# Patient Record
Sex: Female | Born: 1995 | Race: Black or African American | Hispanic: No | Marital: Single | Smoking: Never smoker
Health system: Southern US, Community
[De-identification: ages and names within clinical notes are randomized; demographics above are authoritative.]

## PROBLEM LIST (undated history)

## (undated) DIAGNOSIS — Z789 Other specified health status: Secondary | ICD-10-CM

---
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
## 2013-05-18 ENCOUNTER — Encounter (HOSPITAL_COMMUNITY): Payer: Self-pay | Admitting: Orthopedic Surgery

## 2013-05-18 ENCOUNTER — Encounter (HOSPITAL_COMMUNITY): Payer: Self-pay

## 2013-05-19 ENCOUNTER — Observation Stay (HOSPITAL_COMMUNITY): Payer: BC Managed Care – PPO

## 2013-05-19 ENCOUNTER — Observation Stay (HOSPITAL_COMMUNITY)
Admission: RE | Admit: 2013-05-19 | Discharge: 2013-05-20 | Disposition: A | Discharge status: Home / Self Care | Payer: BC Managed Care – PPO | Source: Ambulatory Visit | Attending: Orthopedic Surgery | Admitting: Orthopedic Surgery

## 2013-05-19 ENCOUNTER — Ambulatory Visit (HOSPITAL_COMMUNITY): Payer: BC Managed Care – PPO | Admitting: Anesthesiology

## 2013-05-19 ENCOUNTER — Encounter (HOSPITAL_COMMUNITY): Payer: Self-pay | Admitting: *Deleted

## 2013-05-19 ENCOUNTER — Encounter (HOSPITAL_COMMUNITY): Payer: Self-pay | Admitting: Anesthesiology

## 2013-05-19 ENCOUNTER — Encounter (HOSPITAL_COMMUNITY)
Admission: RE | Disposition: A | Discharge status: Home / Self Care | Payer: Self-pay | Source: Ambulatory Visit | Attending: Orthopedic Surgery

## 2013-05-19 DIAGNOSIS — M84369A Stress fracture, unspecified tibia and fibula, initial encounter for fracture: Principal | ICD-10-CM | POA: Insufficient documentation

## 2013-05-19 DIAGNOSIS — X58XXXA Exposure to other specified factors, initial encounter: Secondary | ICD-10-CM | POA: Insufficient documentation

## 2013-05-19 DIAGNOSIS — S82201A Unspecified fracture of shaft of right tibia, initial encounter for closed fracture: Secondary | ICD-10-CM

## 2013-05-19 HISTORY — DX: Other specified health status: Z78.9

## 2013-05-19 HISTORY — PX: TIBIA IM NAIL INSERTION: SHX2516

## 2013-05-19 LAB — CBC
HCT: 40.3 % (ref 36.0–46.0)
Hemoglobin: 13.8 g/dL (ref 12.0–15.0)
RBC: 4.68 MIL/uL (ref 3.87–5.11)
WBC: 8.5 10*3/uL (ref 4.0–10.5)

## 2013-05-19 SURGERY — INSERTION, INTRAMEDULLARY ROD, TIBIA
Anesthesia: General | Site: Leg Lower | Laterality: Right | Wound class: Clean

## 2013-05-19 MED ORDER — BUPIVACAINE-EPINEPHRINE (PF) 0.5% -1:200000 IJ SOLN
INTRAMUSCULAR | Status: AC
  Filled 2013-05-19: qty 10

## 2013-05-19 MED ORDER — METHOCARBAMOL 500 MG PO TABS
500.0000 mg | ORAL_TABLET | Freq: Four times a day (QID) | ORAL | Status: DC | PRN
  Administered 2013-05-19 – 2013-05-20 (×2): 500 mg via ORAL
  Filled 2013-05-19 (×2): qty 1

## 2013-05-19 MED ORDER — EPHEDRINE SULFATE 50 MG/ML IJ SOLN
INTRAMUSCULAR | Status: DC | PRN
  Administered 2013-05-19 (×6): 5 mg via INTRAVENOUS

## 2013-05-19 MED ORDER — HYDROMORPHONE HCL PF 1 MG/ML IJ SOLN
0.2500 mg | INTRAMUSCULAR | Status: DC | PRN
  Administered 2013-05-19 (×2): 0.5 mg via INTRAVENOUS

## 2013-05-19 MED ORDER — ONDANSETRON HCL 4 MG/2ML IJ SOLN: 4.0000 mg | Freq: Four times a day (QID) | INTRAMUSCULAR | Status: DC | PRN

## 2013-05-19 MED ORDER — ONDANSETRON HCL 4 MG PO TABS: 4.0000 mg | ORAL_TABLET | Freq: Four times a day (QID) | ORAL | Status: DC | PRN

## 2013-05-19 MED ORDER — ONDANSETRON HCL 4 MG/2ML IJ SOLN
INTRAMUSCULAR | Status: DC | PRN
  Administered 2013-05-19: 4 mg via INTRAVENOUS

## 2013-05-19 MED ORDER — BUPIVACAINE HCL (PF) 0.5 % IJ SOLN
INTRAMUSCULAR | Status: AC
  Filled 2013-05-19: qty 30

## 2013-05-19 MED ORDER — METOCLOPRAMIDE HCL 5 MG/ML IJ SOLN: 5.0000 mg | Freq: Three times a day (TID) | INTRAMUSCULAR | Status: DC | PRN

## 2013-05-19 MED ORDER — METHOCARBAMOL 100 MG/ML IJ SOLN
500.0000 mg | Freq: Four times a day (QID) | INTRAVENOUS | Status: DC | PRN
  Filled 2013-05-19: qty 5

## 2013-05-19 MED ORDER — KETOROLAC TROMETHAMINE 30 MG/ML IJ SOLN
30.0000 mg | Freq: Once | INTRAMUSCULAR | Status: AC
  Administered 2013-05-19: 30 mg via INTRAVENOUS

## 2013-05-19 MED ORDER — GLYCOPYRROLATE 0.2 MG/ML IJ SOLN
INTRAMUSCULAR | Status: DC | PRN
  Administered 2013-05-19 (×2): 0.1 mg via INTRAVENOUS

## 2013-05-19 MED ORDER — OXYCODONE HCL 5 MG PO TABS: 5.0000 mg | ORAL_TABLET | Freq: Once | ORAL | Status: DC | PRN

## 2013-05-19 MED ORDER — FENTANYL CITRATE 0.05 MG/ML IJ SOLN
INTRAMUSCULAR | Status: DC | PRN
  Administered 2013-05-19: 50 ug via INTRAVENOUS
  Administered 2013-05-19 (×2): 25 ug via INTRAVENOUS
  Administered 2013-05-19: 50 ug via INTRAVENOUS
  Administered 2013-05-19 (×4): 25 ug via INTRAVENOUS

## 2013-05-19 MED ORDER — LACTATED RINGERS IV SOLN
INTRAVENOUS | Status: DC
  Administered 2013-05-19 (×2): via INTRAVENOUS

## 2013-05-19 MED ORDER — BUPIVACAINE-EPINEPHRINE 0.5% -1:200000 IJ SOLN
INTRAMUSCULAR | Status: DC | PRN
  Administered 2013-05-19: 30 mL

## 2013-05-19 MED ORDER — PROPOFOL 10 MG/ML IV BOLUS
INTRAVENOUS | Status: DC | PRN
  Administered 2013-05-19: 200 mg via INTRAVENOUS

## 2013-05-19 MED ORDER — CEFAZOLIN SODIUM-DEXTROSE 2-3 GM-% IV SOLR
INTRAVENOUS | Status: DC | PRN
  Administered 2013-05-19: 2 g via INTRAVENOUS

## 2013-05-19 MED ORDER — ASPIRIN EC 325 MG PO TBEC: 325.0000 mg | DELAYED_RELEASE_TABLET | Freq: Every day | ORAL | Status: AC

## 2013-05-19 MED ORDER — POTASSIUM CHLORIDE IN NACL 20-0.9 MEQ/L-% IV SOLN
INTRAVENOUS | Status: AC
  Administered 2013-05-19: 19:00:00 via INTRAVENOUS
  Filled 2013-05-19: qty 1000

## 2013-05-19 MED ORDER — HYDROCODONE-ACETAMINOPHEN 5-325 MG PO TABS: 1.0000 | ORAL_TABLET | Freq: Four times a day (QID) | ORAL | Status: AC | PRN

## 2013-05-19 MED ORDER — DEXAMETHASONE SODIUM PHOSPHATE 4 MG/ML IJ SOLN
INTRAMUSCULAR | Status: DC | PRN
  Administered 2013-05-19: 8 mg via INTRAVENOUS

## 2013-05-19 MED ORDER — CEFAZOLIN SODIUM 1-5 GM-% IV SOLN
1.0000 g | Freq: Four times a day (QID) | INTRAVENOUS | Status: AC
  Administered 2013-05-19: 1 g via INTRAVENOUS
  Filled 2013-05-19: qty 50

## 2013-05-19 MED ORDER — OXYCODONE HCL 5 MG/5ML PO SOLN: 5.0000 mg | Freq: Once | ORAL | Status: DC | PRN

## 2013-05-19 MED ORDER — HYDROMORPHONE HCL PF 1 MG/ML IJ SOLN
INTRAMUSCULAR | Status: AC
  Administered 2013-05-19: 0.5 mg via INTRAVENOUS
  Filled 2013-05-19: qty 1

## 2013-05-19 MED ORDER — MIDAZOLAM HCL 5 MG/5ML IJ SOLN
INTRAMUSCULAR | Status: DC | PRN
  Administered 2013-05-19: 2 mg via INTRAVENOUS

## 2013-05-19 MED ORDER — METOCLOPRAMIDE HCL 10 MG PO TABS: 5.0000 mg | ORAL_TABLET | Freq: Three times a day (TID) | ORAL | Status: DC | PRN

## 2013-05-19 MED ORDER — BUPIVACAINE HCL 0.5 % IJ SOLN
INTRAMUSCULAR | Status: DC | PRN
  Administered 2013-05-19: 15 mL

## 2013-05-19 MED ORDER — 0.9 % SODIUM CHLORIDE (POUR BTL) OPTIME
TOPICAL | Status: DC | PRN
  Administered 2013-05-19: 3000 mL

## 2013-05-19 MED ORDER — LIDOCAINE HCL (CARDIAC) 20 MG/ML IV SOLN
INTRAVENOUS | Status: DC | PRN
  Administered 2013-05-19: 100 mg via INTRAVENOUS

## 2013-05-19 MED ORDER — HYDROCODONE-ACETAMINOPHEN 10-325 MG PO TABS
1.0000 | ORAL_TABLET | ORAL | Status: DC | PRN
  Administered 2013-05-19 (×2): 1 via ORAL
  Administered 2013-05-20: 2 via ORAL
  Filled 2013-05-19: qty 1
  Filled 2013-05-19: qty 2
  Filled 2013-05-19: qty 1

## 2013-05-19 MED ORDER — METOCLOPRAMIDE HCL 5 MG/ML IJ SOLN: 10.0000 mg | Freq: Once | INTRAMUSCULAR | Status: DC | PRN

## 2013-05-19 MED ORDER — MORPHINE SULFATE 2 MG/ML IJ SOLN
1.0000 mg | INTRAMUSCULAR | Status: DC | PRN
  Administered 2013-05-19: 1 mg via INTRAVENOUS
  Filled 2013-05-19: qty 1

## 2013-05-19 MED ORDER — KETOROLAC TROMETHAMINE 30 MG/ML IJ SOLN
INTRAMUSCULAR | Status: AC
  Administered 2013-05-19: 30 mg via INTRAVENOUS
  Filled 2013-05-19: qty 1

## 2013-05-19 SURGICAL SUPPLY — 73 items
BANDAGE ELASTIC 4 VELCRO ST LF (GAUZE/BANDAGES/DRESSINGS) ×2 IMPLANT
BANDAGE ELASTIC 6 VELCRO ST LF (GAUZE/BANDAGES/DRESSINGS) ×2 IMPLANT
BANDAGE ESMARK 6X9 LF (GAUZE/BANDAGES/DRESSINGS) IMPLANT
BANDAGE GAUZE ELAST BULKY 4 IN (GAUZE/BANDAGES/DRESSINGS) ×2 IMPLANT
BENZOIN TINCTURE PRP APPL 2/3 (GAUZE/BANDAGES/DRESSINGS) ×2 IMPLANT
BIT DRILL LONG 4.0 (BIT) ×1 IMPLANT
BIT DRILL SHORT 4.0 (BIT) ×1 IMPLANT
BLADE SURG 15 STRL LF DISP TIS (BLADE) ×1 IMPLANT
BLADE SURG 15 STRL SS (BLADE) ×1
BLADE SURG ROTATE 9660 (MISCELLANEOUS) IMPLANT
BNDG COHESIVE 6X5 TAN STRL LF (GAUZE/BANDAGES/DRESSINGS) ×2 IMPLANT
BNDG ESMARK 6X9 LF (GAUZE/BANDAGES/DRESSINGS)
CLOTH BEACON ORANGE TIMEOUT ST (SAFETY) ×2 IMPLANT
CLSR STERI-STRIP ANTIMIC 1/2X4 (GAUZE/BANDAGES/DRESSINGS) ×2 IMPLANT
COVER SURGICAL LIGHT HANDLE (MISCELLANEOUS) ×4 IMPLANT
CUFF TOURNIQUET SINGLE 34IN LL (TOURNIQUET CUFF) IMPLANT
CUFF TOURNIQUET SINGLE 44IN (TOURNIQUET CUFF) IMPLANT
DRAPE C-ARM 42X72 X-RAY (DRAPES) ×2 IMPLANT
DRAPE INCISE IOBAN 66X45 STRL (DRAPES) ×2 IMPLANT
DRAPE ORTHO SPLIT 77X108 STRL (DRAPES) ×2
DRAPE PROXIMA HALF (DRAPES) ×4 IMPLANT
DRAPE SURG ORHT 6 SPLT 77X108 (DRAPES) ×2 IMPLANT
DRAPE U-SHAPE 47X51 STRL (DRAPES) ×2 IMPLANT
DRILL BIT LONG 4.0 (BIT) ×2
DRILL BIT SHORT 4.0 (BIT) ×1
DRSG PAD ABDOMINAL 8X10 ST (GAUZE/BANDAGES/DRESSINGS) ×2 IMPLANT
DURAPREP 26ML APPLICATOR (WOUND CARE) ×2 IMPLANT
ELECT REM PT RETURN 9FT ADLT (ELECTROSURGICAL) ×2
ELECTRODE REM PT RTRN 9FT ADLT (ELECTROSURGICAL) ×1 IMPLANT
FACESHIELD LNG OPTICON STERILE (SAFETY) IMPLANT
GAUZE XEROFORM 5X9 LF (GAUZE/BANDAGES/DRESSINGS) ×2 IMPLANT
GLOVE BIO SURGEON STRL SZ7.5 (GLOVE) ×2 IMPLANT
GLOVE BIOGEL M SZ8.5 STRL (GLOVE) ×2 IMPLANT
GLOVE BIOGEL PI IND STRL 7.5 (GLOVE) ×1 IMPLANT
GLOVE BIOGEL PI IND STRL 8 (GLOVE) ×1 IMPLANT
GLOVE BIOGEL PI INDICATOR 7.5 (GLOVE) ×1
GLOVE BIOGEL PI INDICATOR 8 (GLOVE) ×1
GLOVE SURG ORTHO 8.0 STRL STRW (GLOVE) ×2 IMPLANT
GLOVE SURG SS PI 7.5 STRL IVOR (GLOVE) ×2 IMPLANT
GOWN BRE IMP PREV XXLGXLNG (GOWN DISPOSABLE) ×2 IMPLANT
GOWN PREVENTION PLUS LG XLONG (DISPOSABLE) IMPLANT
GOWN PREVENTION PLUS XLARGE (GOWN DISPOSABLE) ×2 IMPLANT
GOWN STRL NON-REIN LRG LVL3 (GOWN DISPOSABLE) ×6 IMPLANT
GUIDE PIN 3.2MM (MISCELLANEOUS) ×2
GUIDE PIN ORTH 343X3.2XBRAD (MISCELLANEOUS) ×2 IMPLANT
GUIDE ROD 3.0 (MISCELLANEOUS) ×2
KIT BASIN OR (CUSTOM PROCEDURE TRAY) ×2 IMPLANT
KIT ROOM TURNOVER OR (KITS) ×2 IMPLANT
MANIFOLD NEPTUNE II (INSTRUMENTS) ×2 IMPLANT
NAIL TIBIAL 8.5X37 (Nail) ×2 IMPLANT
NEEDLE HYPO 25GX1X1/2 BEV (NEEDLE) ×2 IMPLANT
NS IRRIG 1000ML POUR BTL (IV SOLUTION) ×6 IMPLANT
PACK GENERAL/GYN (CUSTOM PROCEDURE TRAY) ×2 IMPLANT
PAD ARMBOARD 7.5X6 YLW CONV (MISCELLANEOUS) ×4 IMPLANT
ROD GUIDE 3.0 (MISCELLANEOUS) ×1 IMPLANT
SCREW LP CAPTR TRI 4.5X30 (Screw) ×1 IMPLANT
SCREW TRIGEN LOW PROF 4.5X30 (Screw) ×2 IMPLANT
SCREW TRIGEN LOW PROF 4.5X50 (Screw) ×2 IMPLANT
SCREW TRIGEN LOW PROF 4.5X52.5 (Screw) ×2 IMPLANT
SPONGE GAUZE 4X4 12PLY (GAUZE/BANDAGES/DRESSINGS) ×2 IMPLANT
STAPLER VISISTAT 35W (STAPLE) ×2 IMPLANT
STOCKINETTE IMPERVIOUS LG (DRAPES) ×2 IMPLANT
SUT ETHILON 3 0 PS 1 (SUTURE) ×2 IMPLANT
SUT PROLENE 3 0 SH DA (SUTURE) ×2 IMPLANT
SUT VIC AB 0 CT1 27 (SUTURE) ×1
SUT VIC AB 0 CT1 27XBRD ANBCTR (SUTURE) ×1 IMPLANT
SUT VIC AB 2-0 CT1 27 (SUTURE) ×1
SUT VIC AB 2-0 CT1 TAPERPNT 27 (SUTURE) ×1 IMPLANT
SYR CONTROL 10ML LL (SYRINGE) ×2 IMPLANT
TOWEL OR 17X24 6PK STRL BLUE (TOWEL DISPOSABLE) ×2 IMPLANT
TOWEL OR 17X26 10 PK STRL BLUE (TOWEL DISPOSABLE) ×2 IMPLANT
TRAY FOLEY CATH 16FRSI W/METER (SET/KITS/TRAYS/PACK) IMPLANT
WATER STERILE IRR 1000ML POUR (IV SOLUTION) ×2 IMPLANT

## 2013-05-19 NOTE — Preoperative (Signed)
Beta Blockers   Reason not to administer Beta Blockers:Not Applicable 

## 2013-05-19 NOTE — Op Note (Signed)
NAMEMORGAINE, [name]             ACCOUNT NO.:  [id number]  MEDICAL RECORD NO.:  [id number]  LOCATION:  5N24C                        FACILITY:  [facility]  PHYSICIAN:  Burnard Bunting, M.D.    DATE OF BIRTH:  [birth year]  DATE OF PROCEDURE: DATE OF DISCHARGE:                              OPERATIVE REPORT   PREOPERATIVE DIAGNOSIS:  Right tibial stress fracture.  POSTOPERATIVE DIAGNOSIS:  Right tibial stress fracture.  PROCEDURE:  Right tibial IM nail Smith and Nephew nail, 1 proximal and 1 distal interlocking 8.5 x 37 mm, 4.5 mm x 50 proximal screw, 4.5 mm x 30 distal screw.  INDICATIONS:  Sara Bennett is a patient with right tibial stress fracture, presents for operative management after explanation of risks and benefits.  PROCEDURE IN DETAIL:  The patient was brought to operating room, where general endotracheal anesthesia was induced.  Preop antibiotics were administered.  Time-out was called.  Right leg was prescrubbed with alcohol and Betadine, prepped with DuraPrep solution and draped in a sterile manner.  Collier Flowers was used to cover the operative field.  Time-out was called.  The incision was made on the medial aspect of the patellar tendon.  Skin and subcutaneous tissue was sharply divided.  Fat pad was elevated off the tibial plateau.  Guide pin was placed in the central portion of the plateau.  A 1 cm posterior guide pin placed and then taken with the knee in full flexion into the tibial shaft.  Correct placement of guide pins were confirmed from the AP and lateral planes. Proximal reaming was performed.  The guide pin was then placed down to the center of the ankle joint.  Reaming was performed up to 9.5 mm.  An 8.5 x 37 nail was then placed, and 1 proximal interlocking screw, and 1 distal locking screw placed.  All incisions were thoroughly irrigated. Incisions were proximally closed using #1 Vicryl, #0 Vicryl, 2-0 Vicryl, and 3-0 Prolene.  Distal and proximal interlock  incisions were irrigated and closed using 3-0 nylon.  Bulky dressing applied.  The patient tolerated the procedure well without immediate complications.     Burnard Bunting, M.D.     GSD/MEDQ  D:  05/19/2013  T:  05/19/2013  Job:  [id number]

## 2013-05-19 NOTE — Brief Op Note (Signed)
05/19/2013  3:22 PM  PATIENT:  Sara Bennett  17 y.o. female  PRE-OPERATIVE DIAGNOSIS:  Tibial Stress Fracture  POST-OPERATIVE DIAGNOSIS:  Tibial Stress Fracture  PROCEDURE:  Procedure(s): INTRAMEDULLARY (IM) NAIL TIBIAL  SURGEON:  Surgeon(s): Cammy Copa, MD  ASSISTANT:   ANESTHESIA:   general  EBL: 50 ml    Total I/O In: 1000 [I.V.:1000] Out: -   BLOOD ADMINISTERED: none  DRAINS: none   LOCAL MEDICATIONS USED:  Lidocaine   SPECIMEN:  No Specimen  COUNTS:  YES  TOURNIQUET:  * No tourniquets in log *  DICTATION: .Other Dictation: Dictation Number [id number]  PLAN OF CARE: Discharge to home after PACU  PATIENT DISPOSITION:  PACU - hemodynamically stable

## 2013-05-19 NOTE — Progress Notes (Signed)
Pt for obs s/p tibial nail Dc am rx on chart WBAT

## 2013-05-19 NOTE — OR Nursing (Signed)
OR documentation done by E. Niraj Kudrna RNFA from 1455 to the end of the case.

## 2013-05-19 NOTE — H&P (Signed)
Sara Bennett is an 17 y.o. female.   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 She was evaluated by Denyse Amass the athletic trainer who discovered on radiograph and examination that she had a tibial stress fracture. She reports pain with weightbearing. Her pain is increasing gradually over the past week. Denies a history of injury. 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 She denies any left leg symptoms. Denies any numbness and tingling in her foot.  Past Medical History  Diagnosis Date  . Medical history non-contributory     Past Surgical History  Procedure Laterality Date  . Cyst excision  2013    back side  . Fasciotomy Right 2013    lower leg    History reviewed. No pertinent family history. Social History:  reports that she has never smoked. She does not have any smokeless tobacco history on file. She reports that she does not drink alcohol or use illicit drugs.  Allergies: No Known Allergies  Medications Prior to Admission  Medication Sig Dispense Refill  . ibuprofen (ADVIL,MOTRIN) 200 MG tablet Take 200 mg by mouth every 6 (six) hours as needed for pain.      . Loratadine (CLARITIN) 10 MG CAPS Take by mouth.        Results for orders placed during the hospital encounter of 05/19/13 (from the past 48 hour(s))  CBC     Status: None   Collection Time    05/19/13 12:16 PM      Result Value Range   WBC 8.5  4.0 - 10.5 K/uL   RBC 4.68  3.87 - 5.11 MIL/uL   Hemoglobin 13.8  12.0 - 15.0 g/dL   HCT 09.8  11.9 - 14.7 %   MCV 86.1  78.0 - 100.0 fL   MCH 29.5  26.0 - 34.0 pg   MCHC 34.2  30.0 - 36.0 g/dL   RDW 82.9  56.2 - 13.0 %   Platelets 228  150 - 400 K/uL   No results found.  Review of Systems  Constitutional: Negative.   HENT: Negative.   Eyes: Negative.   Respiratory: Negative.   Cardiovascular: Negative.    Gastrointestinal: Negative.   Genitourinary: Negative.   Musculoskeletal: Positive for joint pain.  Skin: Negative.   Neurological: Negative.   Endo/Heme/Allergies: Negative.   Psychiatric/Behavioral: Negative.     Blood pressure 130/71, pulse 76, temperature 97.1 F (36.2 C), temperature source Oral, resp. rate 20, last menstrual period 05/13/2013, SpO2 99.00%. Physical Exam  Constitutional: She appears well-developed.  HENT:  Head: Normocephalic.  Eyes: Pupils are equal, round, and reactive to light.  Neck: Normal range of motion.  Cardiovascular: Normal rate.   Respiratory: Effort normal.  GI: Soft.  Neurological: She is alert.  Skin: Skin is warm.  Psychiatric: She has a normal mood and affect.   the insertion redemonstrate soft compartments palpable pedal pulses focal tenderness at the junction of the proximal two thirds and distal one third of the tibia with exostosis present. Ankle dorsiflexion plantar flexion is intact.  Assessment/Plan Impression is right leg tibial stress fracture with black line visible on lateral radiographs. Plan intramedullary nail tibia. Risk and benefits discussed with the patient and her father via phone call today including a limited to infection nerve vessel damage incomplete healing and persistent pain anticipate about 4 week return to playing and practicing basketball. There  outpatient observation tonight based on her symptoms. All questions answered. No family history of DVT.Rise Paganini SCOTT 05/19/2013, 12:55 PM

## 2013-05-19 NOTE — Transfer of Care (Signed)
Immediate Anesthesia Transfer of Care Note  Patient: Sara Bennett  Procedure(s) Performed: Procedure(s): INTRAMEDULLARY (IM) NAIL TIBIAL (Right)  Patient Location: PACU  Anesthesia Type:General  Level of Consciousness: sedated  Airway & Oxygen Therapy: Patient connected to nasal cannula oxygen  Post-op Assessment: Report given to PACU RN and Post -op Vital signs reviewed and stable  Post vital signs: Reviewed and stable  Complications: No apparent anesthesia complications

## 2013-05-19 NOTE — Anesthesia Preprocedure Evaluation (Signed)
Anesthesia Evaluation  Patient identified by MRN, date of birth, ID band Patient awake    Reviewed: Allergy & Precautions, H&P , NPO status , Patient's Chart, lab work & pertinent test results, reviewed documented beta blocker date and time   Airway Mallampati: II TM Distance: >3 FB Neck ROM: full    Dental   Pulmonary neg pulmonary ROS,  breath sounds clear to auscultation        Cardiovascular negative cardio ROS  Rhythm:regular     Neuro/Psych negative neurological ROS  negative psych ROS   GI/Hepatic negative GI ROS, Neg liver ROS,   Endo/Other  negative endocrine ROS  Renal/GU negative Renal ROS  negative genitourinary   Musculoskeletal   Abdominal   Peds  Hematology negative hematology ROS (+)   Anesthesia Other Findings See surgeon's H&P   Reproductive/Obstetrics negative OB ROS                           Anesthesia Physical Anesthesia Plan  ASA: I  Anesthesia Plan: General   Post-op Pain Management:    Induction: Intravenous  Airway Management Planned: LMA  Additional Equipment:   Intra-op Plan:   Post-operative Plan:   Informed Consent: I have reviewed the patients History and Physical, chart, labs and discussed the procedure including the risks, benefits and alternatives for the proposed anesthesia with the patient or authorized representative who has indicated his/her understanding and acceptance.   Dental Advisory Given  Plan Discussed with: CRNA and Surgeon  Anesthesia Plan Comments:         Anesthesia Quick Evaluation  

## 2013-05-19 NOTE — Anesthesia Procedure Notes (Signed)
Procedure Name: LMA Insertion Date/Time: 05/19/2013 1:22 PM Performed by: Charm Barges, Lydiana Milley R Pre-anesthesia Checklist: Patient identified, Emergency Drugs available, Suction available, Patient being monitored and Timeout performed Patient Re-evaluated:Patient Re-evaluated prior to inductionOxygen Delivery Method: Circle system utilized Preoxygenation: Pre-oxygenation with 100% oxygen Intubation Type: IV induction Ventilation: Mask ventilation without difficulty LMA: LMA inserted LMA Size: 5.0 Number of attempts: 1 Placement Confirmation: positive ETCO2 and breath sounds checked- equal and bilateral Tube secured with: Tape Dental Injury: Teeth and Oropharynx as per pre-operative assessment

## 2013-05-20 DIAGNOSIS — M84369A Stress fracture, unspecified tibia and fibula, initial encounter for fracture: Secondary | ICD-10-CM | POA: Diagnosis not present

## 2013-05-20 NOTE — Discharge Summary (Signed)
Patient ID: Sara Bennett MRN: [id number] DOB/AGE: October 21, 1995 17 y.o.  Admit date: 05/19/2013 Discharge date: 05/20/2013  Admission Diagnoses: Tibial Stress fracture Active Problems:   * No active hospital problems. *   Discharge Diagnoses:  S/p right tibial stress fracture IM nailing  Past Medical History  Diagnosis Date  . Medical history non-contributory     Surgeries: Procedure(s): INTRAMEDULLARY (IM) NAIL TIBIAL on 05/19/2013   Consultants:    Discharged Condition: Improved  Hospital Course: Sara Bennett is an 17 y.o. female who was admitted 05/19/2013 for operative treatment of<principal problem not specified>. Patient has severe unremitting pain that affects sleep, daily activities, and work/hobbies. After pre-op clearance the patient was taken to the operating room on 05/19/2013 and underwent  Procedure(s): INTRAMEDULLARY (IM) NAIL TIBIAL.    Patient was given perioperative antibiotics: Anti-infectives   Start     Dose/Rate Route Frequency Ordered Stop   05/19/13 1845  ceFAZolin (ANCEF) IVPB 1 g/50 mL premix     1 g 100 mL/hr over 30 Minutes Intravenous Every 6 hours 05/19/13 1753 05/19/13 1913       Patient was given sequential compression devices, early ambulation, and chemoprophylaxis to prevent DVT.  Patient benefited maximally from hospital stay and there were no complications.    Recent vital signs: Patient Vitals for the past 24 hrs:  BP Temp Temp src Pulse Resp SpO2 Height Weight  05/20/13 0618 113/42 mmHg 97.8 F (36.6 C) - 53 16 99 % - -  05/20/13 0149 112/40 mmHg 98.6 F (37 C) - 55 16 99 % - -  05/19/13 2300 - - - - - - 5\' 10"  (1.778 m) 70.308 kg (155 lb)  05/19/13 2207 132/57 mmHg 97.8 F (36.6 C) - 55 16 99 % - -  05/19/13 1746 129/76 mmHg 97.7 F (36.5 C) - 57 18 100 % - -  05/19/13 1715 110/58 mmHg 97.9 F (36.6 C) - 67 20 100 % - -  05/19/13 1700 117/59 mmHg - - 56 15 100 % - -  05/19/13 1645 116/63 mmHg - - 60 21 100 % - -  05/19/13  1630 115/64 mmHg - - 59 23 100 % - -  05/19/13 1615 119/71 mmHg - - 59 20 100 % - -  05/19/13 1600 128/65 mmHg - - 67 21 100 % - -  05/19/13 1545 120/73 mmHg - - 62 18 100 % - -  05/19/13 1530 126/69 mmHg - - 81 16 100 % - -  05/19/13 1515 128/69 mmHg 97.5 F (36.4 C) - 81 16 100 % - -  05/19/13 1109 130/71 mmHg 97.1 F (36.2 C) Oral 76 20 99 % - -     Recent laboratory studies:  Recent Labs  05/19/13 1216  WBC 8.5  HGB 13.8  HCT 40.3  PLT 228     Discharge Medications:     Medication List         aspirin EC 325 MG tablet  Take 1 tablet (325 mg total) by mouth daily.     CLARITIN 10 MG Caps  Generic drug:  Loratadine  Take by mouth.     HYDROcodone-acetaminophen 5-325 MG per tablet  Commonly known as:  NORCO  Take 1-2 tablets by mouth every 6 (six) hours as needed for pain.     ibuprofen 200 MG tablet  Commonly known as:  ADVIL,MOTRIN  Take 200 mg by mouth every 6 (six) hours as needed for pain.  Diagnostic Studies: Dg Tibia/fibula Right  05/19/2013   *RADIOLOGY REPORT*  Clinical Data: Stress fracture  DG C-ARM 1-60 MIN,RIGHT TIBIA AND FIBULA - 2 VIEW  Comparison: None.  Findings: Multiple intraoperative images demonstrate placement of an intramedullary rod in the tibia the proximal approach across a mid stress fracture.  One proximal and one distal interlocking screw.  IMPRESSION: ORIF tibia stress fracture.   Original Report Authenticated By: Jolaine Click, M.D.   Dg C-arm 1-60 Min  05/19/2013   *RADIOLOGY REPORT*  Clinical Data: Stress fracture  DG C-ARM 1-60 MIN,RIGHT TIBIA AND FIBULA - 2 VIEW  Comparison: None.  Findings: Multiple intraoperative images demonstrate placement of an intramedullary rod in the tibia the proximal approach across a mid stress fracture.  One proximal and one distal interlocking screw.  IMPRESSION: ORIF tibia stress fracture.   Original Report Authenticated By: Jolaine Click, M.D.    Disposition: Stable to home      Discharge  Orders   Future Orders Complete By Expires   Call MD / Call 911  As directed    Comments:     If you experience chest pain or shortness of breath, CALL 911 and be transported to the hospital emergency room.  If you develope a fever above 101 F, pus (white drainage) or increased drainage or redness at the wound, or calf pain, call your surgeon's office.   Call MD / Call 911  As directed    Comments:     If you experience chest pain or shortness of breath, CALL 911 and be transported to the hospital emergency room.  If you develope a fever above 101 F, pus (white drainage) or increased drainage or redness at the wound, or calf pain, call your surgeon's office.   Constipation Prevention  As directed    Comments:     Drink plenty of fluids.  Prune juice may be helpful.  You may use a stool softener, such as Colace (over the counter) 100 mg twice a day.  Use MiraLax (over the counter) for constipation as needed.   Constipation Prevention  As directed    Comments:     Drink plenty of fluids.  Prune juice may be helpful.  You may use a stool softener, such as Colace (over the counter) 100 mg twice a day.  Use MiraLax (over the counter) for constipation as needed.   Diet - low sodium heart healthy  As directed    Diet - low sodium heart healthy  As directed    Discharge instructions  As directed    Comments:     Weight bearing as tolerated right leg with crutches Knee range of motion as tolerated Change dressing Friday Keep incisions dry   Discharge instructions  As directed    Comments:     See other order set   Increase activity slowly as tolerated  As directed    Increase activity slowly as tolerated  As directed    Weight bearing as tolerated  As directed          Signed: Richardean Canal 05/20/2013, 7:57 AM

## 2013-05-20 NOTE — Progress Notes (Signed)
Patient has no further PT needs. I provided her with discharge instructions and follow up appointment information. She is going home at discharge with friend/family support.

## 2013-05-20 NOTE — Progress Notes (Signed)
Subjective: 1 Day Post-Op Procedure(s) (LRB): INTRAMEDULLARY (IM) NAIL TIBIAL (Right) Patient reports pain as mild.  No complaints.  Objective: Vital signs in last 24 hours: Temp:  [97.1 F (36.2 C)-98.6 F (37 C)] 97.8 F (36.6 C) (09/11 0618) Pulse Rate:  [53-81] 53 (09/11 0618) Resp:  [15-23] 16 (09/11 0618) BP: (110-132)/(40-76) 113/42 mmHg (09/11 0618) SpO2:  [99 %-100 %] 99 % (09/11 0618) Weight:  [70.308 kg (155 lb)] 70.308 kg (155 lb) (09/10 2300)  Intake/Output from previous day: 09/10 0701 - 09/11 0700 In: 1794.2 [P.O.:600; I.V.:1194.2] Out: -  Intake/Output this shift:     Recent Labs  05/19/13 1216  HGB 13.8    Recent Labs  05/19/13 1216  WBC 8.5  RBC 4.68  HCT 40.3  PLT 228   No results found for this basename: NA, K, CL, CO2, BUN, CREATININE, GLUCOSE, CALCIUM,  in the last 72 hours No results found for this basename: LABPT, INR,  in the last 72 hours  Neurologically intact Incision: dressing C/D/I Compartment soft  Assessment/Plan: 1 Day Post-Op Procedure(s) (LRB): INTRAMEDULLARY (IM) NAIL TIBIAL (Right) Discharge to home this AM. CLARK, GILBERT 05/20/2013, 7:52 AM

## 2013-05-20 NOTE — Evaluation (Signed)
Physical Therapy Evaluation Patient Details Name: Sara Bennett MRN: [id number] DOB: 11/01/1995 Today's Date: 05/20/2013 Time: 7829-5621 PT Time Calculation (min): 14 min  PT Assessment / Plan / Recommendation History of Present Illness  s/p IM nail due to tibia stress fx   Clinical Impression  Patient is s/p IM tibial nail surgery resulting in functional limitations due to the deficits listed below (see PT Problem List). Pt moving well today and is mod I to independent for mobility with minor gt abnormalities. Pt will have athletic trainer [facility] for re[facility] and friends to (A) her. No further acute PT needs at this time.      PT Assessment  Patent does not need any further PT services;All further PT needs can be met in the next venue of care    Follow Up Recommendations  Outpatient PT;Supervision for mobility/OOB;Supervision - Intermittent    Does the patient have the potential to tolerate intense rehabilitation      Barriers to Discharge        Equipment Recommendations  None recommended by PT    Recommendations for Other Services     Frequency      Precautions / Restrictions Precautions Precautions: None Restrictions Weight Bearing Restrictions: Yes RLE Weight Bearing: Weight bearing as tolerated   Pertinent Vitals/Pain 4/10 prior to session; 6/10 in Rt knee with WB. Pt premedicated      Mobility  Bed Mobility Bed Mobility: Supine to Sit;Sitting - Scoot to Edge of Bed Supine to Sit: 7: Independent Sitting - Scoot to Edge of Bed: 7: Independent Details for Bed Mobility Assistance: pt demo good technique; no physical (A) needed Transfers Transfers: Sit to Stand;Stand to Sit Sit to Stand: 5: Supervision;6: Modified independent (Device/Increase time);From bed Stand to Sit: 6: Modified independent (Device/Increase time) Details for Transfer Assistance: supervision for safety initially; min cues for technique with crutches; no physical (A) needed; pt good balance   Ambulation/Gait Ambulation/Gait Assistance: 6: Modified independent (Device/Increase time) Ambulation Distance (Feet): 10 Feet Assistive device: Crutches Ambulation/Gait Assistance Details: cues for heel strike and to increase WB on Rt LE  Gait Pattern: Decreased stance time - right;Decreased step length - left (decr heel strike Rt LE ) Gait velocity: decr due to pain  General Gait Details: c/o knee pain 6/10 when WB Stairs: No Wheelchair Mobility Wheelchair Mobility: No    Exercises  ankle pumps x10 both   PT Diagnosis: Abnormality of gait;Acute pain  PT Problem List: Decreased strength;Decreased range of motion;Decreased mobility;Pain PT Treatment Interventions:        Acute Rehab PT Goals Patient Stated Goal: to get back to basketball PT Goal Formulation: No goals set, d/c therapy  Visit Information  Last PT Received On: 05/20/13 Assistance Needed: +1 History of Present Illness: s/p IM nail due to tibia stress fx        Prior Functioning  Home Living Family/patient expects to be discharged to:: Private residence Living Arrangements: Other (Comment) (Roommates ) Available Help at Discharge: Friend(s);Other (Comment);Available 24 hours/day Fish farm manager ) Type of Home: Other(Comment) (college dorm ) Home Access: Elevator Home Layout: One level Home Equipment: Crutches Prior Function Level of Independence: Independent 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; has been on crutches previously  Communication Communication: No difficulties    Cognition  Cognition Arousal/Alertness: Awake/alert Behavior During Therapy: WFL for tasks assessed/performed Overall Cognitive Status: Within Functional Limits for tasks assessed    Extremity/Trunk Assessment Upper Extremity Assessment Upper Extremity Assessment: Overall WFL for tasks assessed Lower Extremity Assessment  Lower Extremity Assessment: RLE deficits/detail RLE: Unable to fully assess due to pain Cervical /  Trunk Assessment Cervical / Trunk Assessment: Normal   Balance Balance Balance Assessed: Yes Static Standing Balance Static Standing - Balance Support: No upper extremity supported;During functional activity Static Standing - Level of Assistance: 7: Independent  End of Session PT - End of Session Activity Tolerance: Patient tolerated treatment well Patient left: Other (comment);with family/visitor present (in bathroom) Nurse Communication: Mobility status  GP Functional Assessment Tool Used: clinical judgement  Functional Limitation: Mobility: Walking and moving around Mobility: Walking and Moving Around Current Status ([id number]): At least 1 percent but less than 20 percent impaired, limited or restricted Mobility: Walking and Moving Around Goal Status [phone]): 0 percent impaired, limited or restricted Mobility: Walking and Moving Around Discharge Status [phone]): At least 1 percent but less than 20 percent impaired, limited or restricted   Donell Sievert, Martin [phone] 05/20/2013, 8:31 AM

## 2013-05-20 NOTE — Anesthesia Postprocedure Evaluation (Signed)
Anesthesia Post Note  Patient: Sara Bennett  Procedure(s) Performed: Procedure(s) (LRB): INTRAMEDULLARY (IM) NAIL TIBIAL (Right)  Anesthesia type: General  Patient location: PACU  Post pain: Pain level controlled  Post assessment: Patient's Cardiovascular Status Stable  Last Vitals:  Filed Vitals:   05/20/13 0149  BP: 112/40  Pulse: 55  Temp: 37 C  Resp: 16    Post vital signs: Reviewed and stable  Level of consciousness: alert  Complications: No apparent anesthesia complications

## 2013-05-21 ENCOUNTER — Encounter (HOSPITAL_COMMUNITY): Payer: Self-pay | Admitting: Orthopedic Surgery

## 2013-06-02 ENCOUNTER — Ambulatory Visit (HOSPITAL_COMMUNITY)
Admission: RE | Admit: 2013-06-02 | Discharge: 2013-06-02 | Disposition: A | Discharge status: Home / Self Care | Payer: BC Managed Care – PPO | Source: Ambulatory Visit | Attending: Orthopedic Surgery | Admitting: Orthopedic Surgery

## 2013-06-02 ENCOUNTER — Other Ambulatory Visit (HOSPITAL_COMMUNITY): Payer: Self-pay | Admitting: Orthopedic Surgery

## 2013-06-02 DIAGNOSIS — M79609 Pain in unspecified limb: Secondary | ICD-10-CM | POA: Insufficient documentation

## 2013-06-02 DIAGNOSIS — M25561 Pain in right knee: Secondary | ICD-10-CM

## 2013-06-02 DIAGNOSIS — M7989 Other specified soft tissue disorders: Secondary | ICD-10-CM | POA: Insufficient documentation

## 2013-06-02 NOTE — Progress Notes (Signed)
VASCULAR LAB PRELIMINARY  PRELIMINARY  PRELIMINARY  PRELIMINARY  Right lower extremity venous duplex completed.    Preliminary report:  Right:  No evidence of DVT, superficial thrombosis, or Baker's cyst.  Jennetta Flood, RVS 06/02/2013, 12:17 PM

## 2013-10-28 ENCOUNTER — Encounter: Payer: Self-pay | Admitting: Family Medicine

## 2014-02-21 ENCOUNTER — Encounter: Payer: Self-pay | Admitting: Family Medicine

## 2014-05-31 IMAGING — RF DG TIBIA/FIBULA 2V*R*
1 series · 6 of 6 positions shown · non-contrast
Comparison: None.

CLINICAL DATA: Stress fracture

DG C-ARM 1-60 MIN,RIGHT TIBIA AND FIBULA - 2 VIEW

[Series 1: run · 6 of 6 slices shown]
[im 1/6]
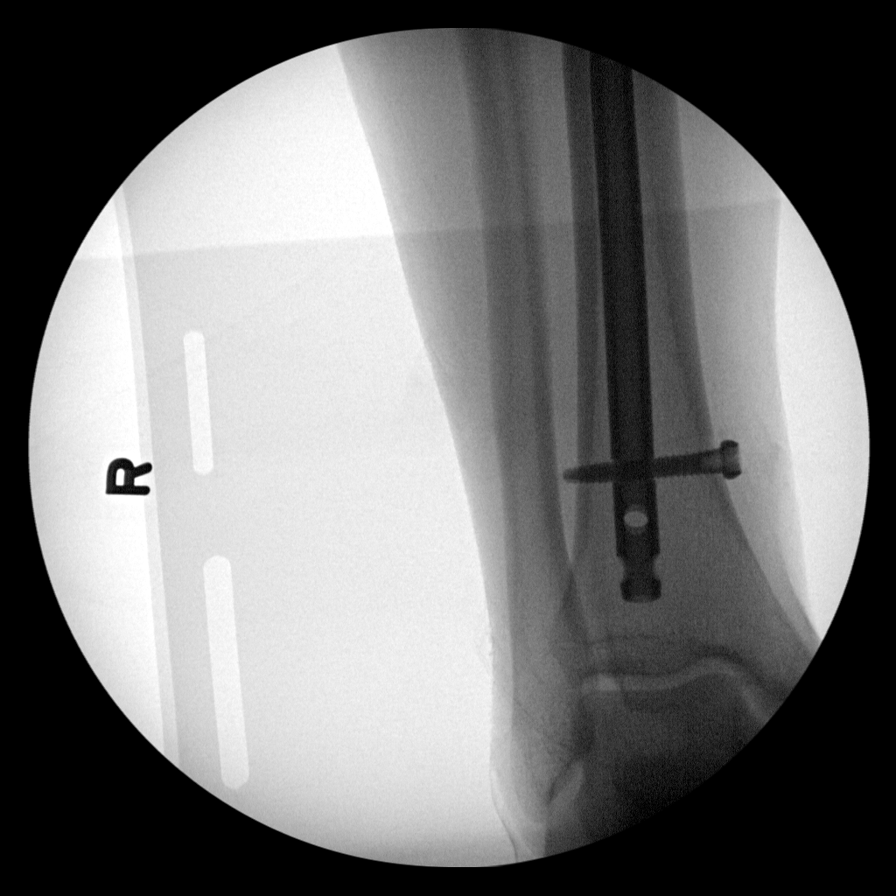
[im 2/6]
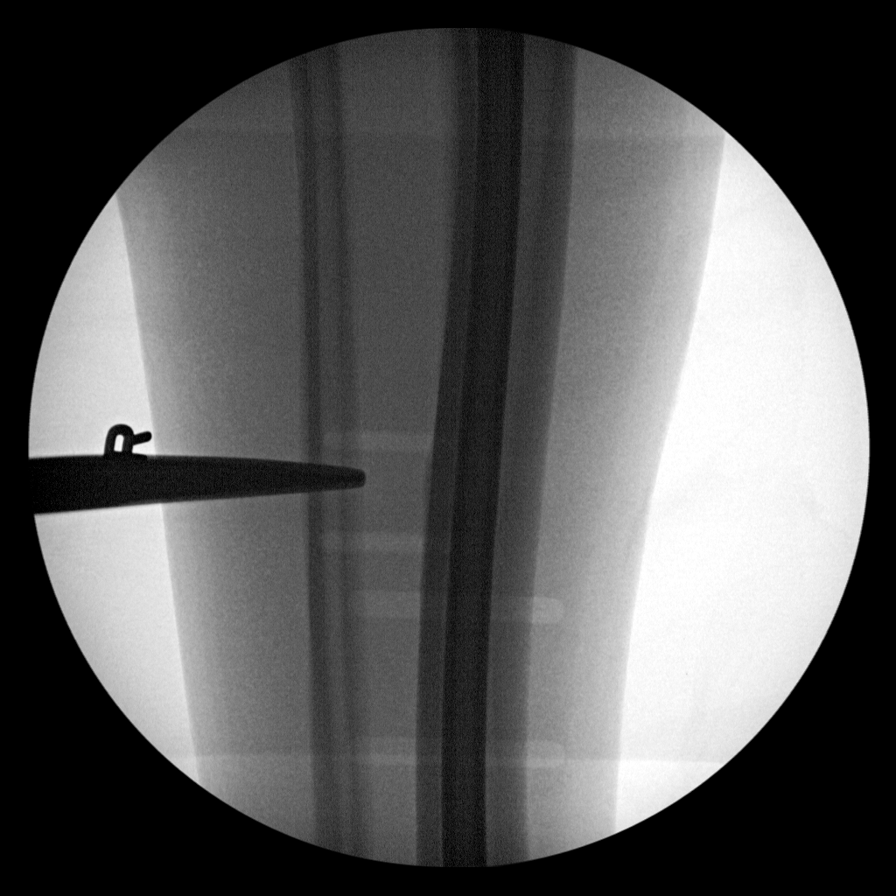
[im 3/6]
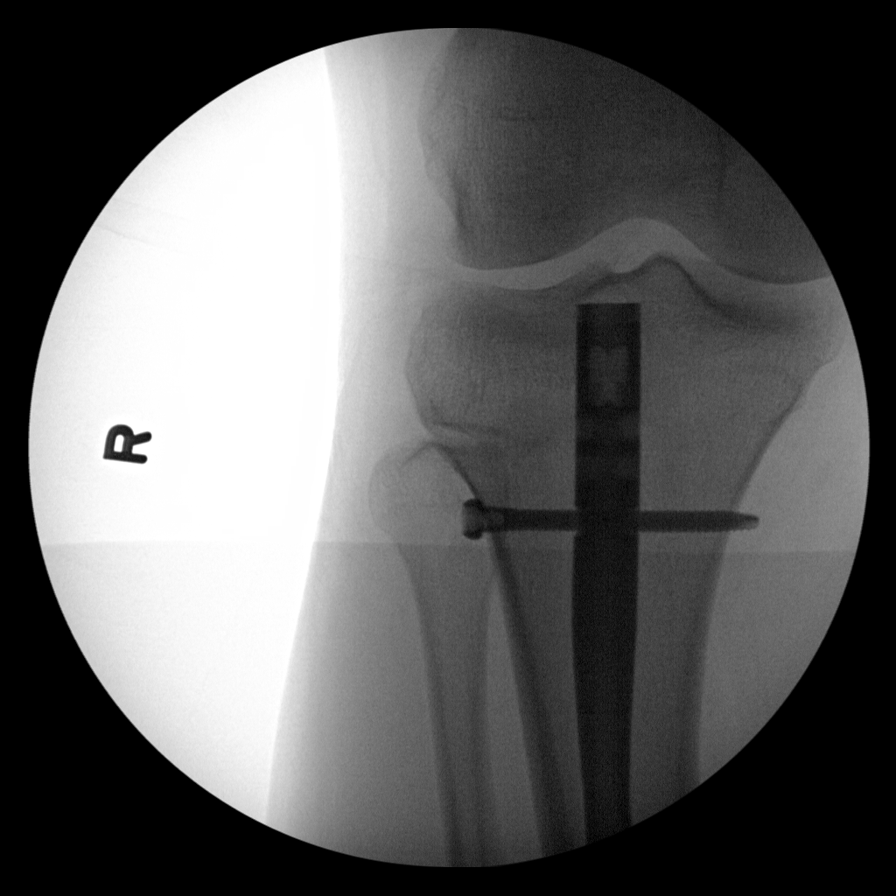
[im 4/6]
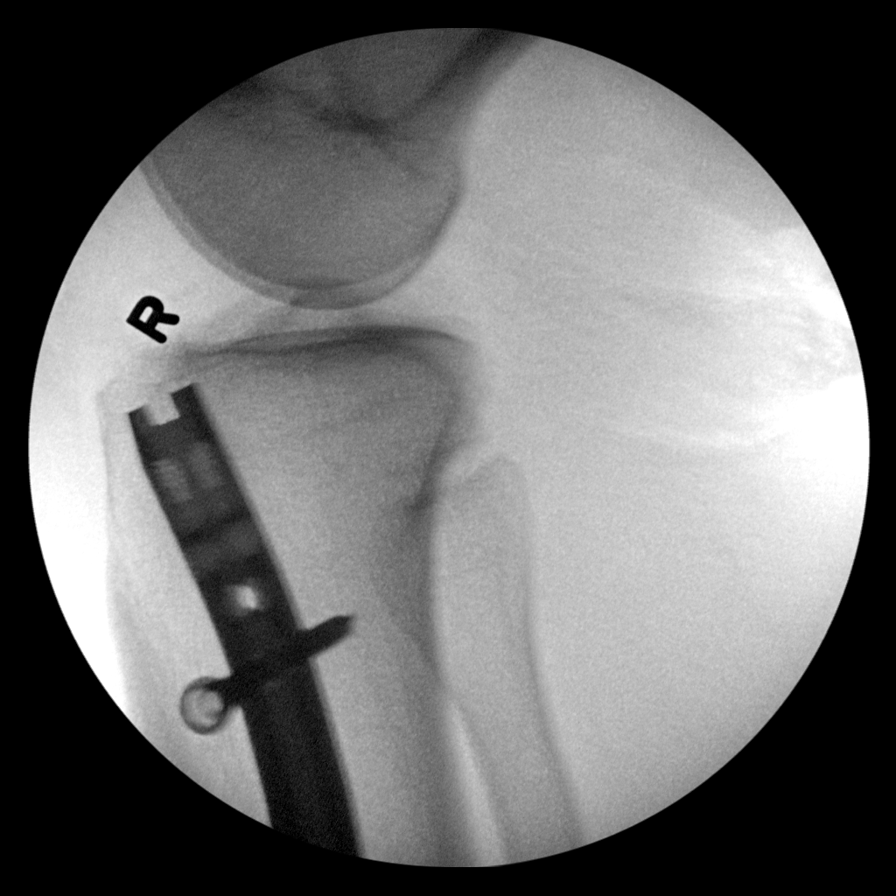
[im 5/6]
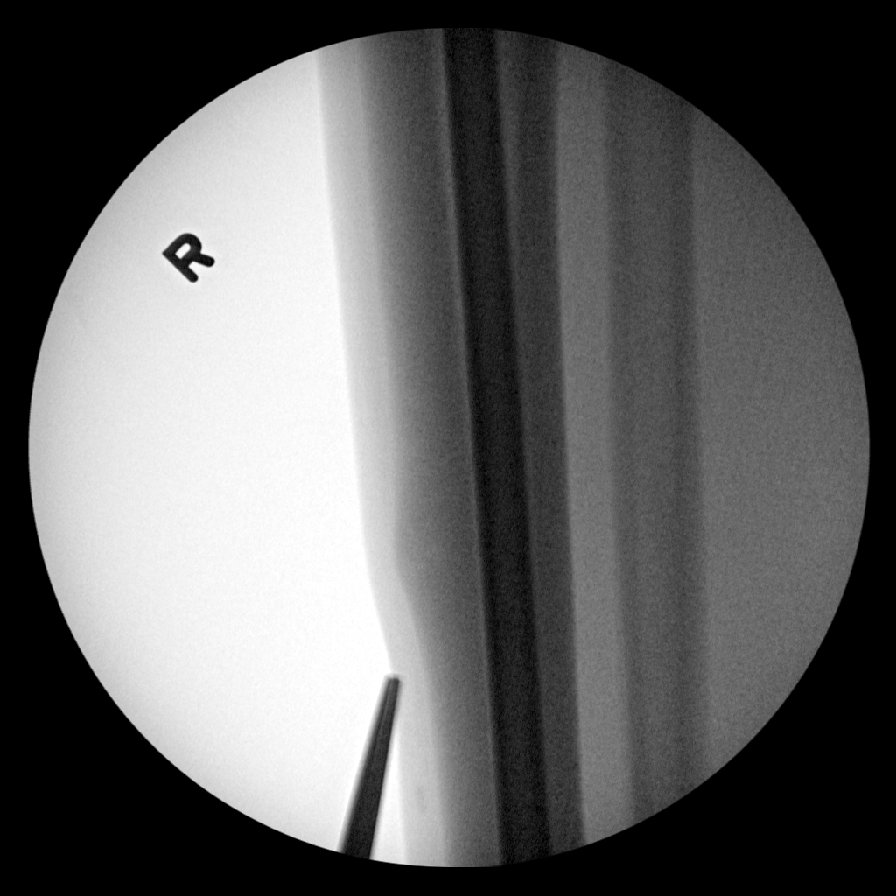
[im 6/6]
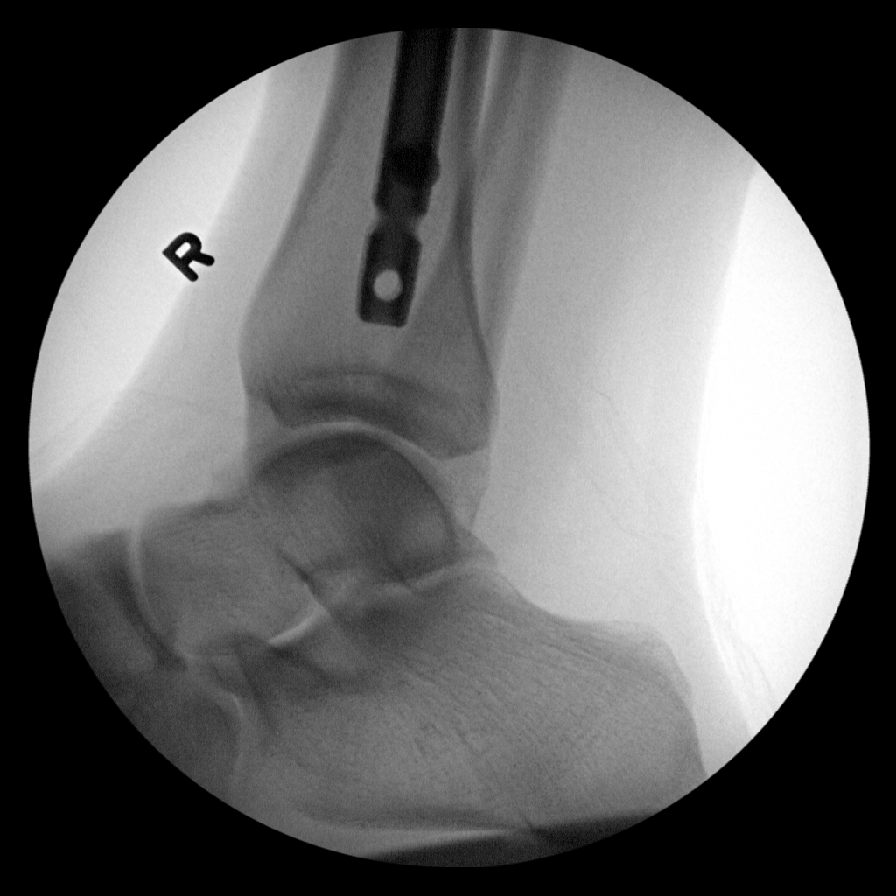

[6 of 6 positions shown; findings below may reference images not displayed]

FINDINGS: Multiple intraoperative images demonstrate placement of
an intramedullary rod in the tibia the proximal approach across a
mid stress fracture.  One proximal and one distal interlocking
screw.
IMPRESSION: ORIF tibia stress fracture.

## 2015-04-04 ENCOUNTER — Emergency Department (HOSPITAL_COMMUNITY)
Admission: EM | Admit: 2015-04-04 | Discharge: 2015-04-04 | Disposition: A | Discharge status: Home / Self Care | Payer: BLUE CROSS/BLUE SHIELD | Attending: Emergency Medicine | Admitting: Emergency Medicine

## 2015-04-04 ENCOUNTER — Encounter (HOSPITAL_COMMUNITY): Payer: Self-pay | Admitting: *Deleted

## 2015-04-04 DIAGNOSIS — Z79899 Other long term (current) drug therapy: Secondary | ICD-10-CM | POA: Insufficient documentation

## 2015-04-04 DIAGNOSIS — Z3202 Encounter for pregnancy test, result negative: Secondary | ICD-10-CM | POA: Diagnosis not present

## 2015-04-04 DIAGNOSIS — R319 Hematuria, unspecified: Secondary | ICD-10-CM | POA: Diagnosis not present

## 2015-04-04 DIAGNOSIS — R109 Unspecified abdominal pain: Secondary | ICD-10-CM | POA: Diagnosis present

## 2015-04-04 DIAGNOSIS — Z7982 Long term (current) use of aspirin: Secondary | ICD-10-CM | POA: Diagnosis not present

## 2015-04-04 LAB — URINE MICROSCOPIC-ADD ON

## 2015-04-04 LAB — URINALYSIS, ROUTINE W REFLEX MICROSCOPIC
Glucose, UA: NEGATIVE mg/dL
Ketones, ur: 15 mg/dL — AB
Nitrite: POSITIVE — AB
PH: 5 (ref 5.0–8.0)
Protein, ur: 100 mg/dL — AB
SPECIFIC GRAVITY, URINE: 1.028 (ref 1.005–1.030)
Urobilinogen, UA: 1 mg/dL (ref 0.0–1.0)

## 2015-04-04 LAB — POC URINE PREG, ED: PREG TEST UR: NEGATIVE

## 2015-04-04 MED ORDER — CEPHALEXIN 500 MG PO CAPS: 500.0000 mg | ORAL_CAPSULE | Freq: Three times a day (TID) | ORAL | Status: AC

## 2015-04-04 MED ORDER — CEPHALEXIN 250 MG PO CAPS
500.0000 mg | ORAL_CAPSULE | Freq: Once | ORAL | Status: AC
  Administered 2015-04-04: 500 mg via ORAL
  Filled 2015-04-04: qty 2

## 2015-04-04 NOTE — ED Provider Notes (Signed)
CSN: [id number]     Arrival date & time 04/04/15  0014 History   First MD Initiated Contact with Patient 04/04/15 0451     Chief Complaint  Patient presents with  . Abdominal Pain  . Hematuria     (Consider location/radiation/quality/duration/timing/severity/associated sxs/prior Treatment) Patient is a 19 y.o. female presenting with abdominal pain and hematuria. The history is provided by the patient.  Abdominal Pain Associated symptoms: hematuria   Hematuria Associated symptoms include abdominal pain.  She noted gross hematuria on what the episode of urinating today. This happened once last year but she never sought medical attention. There is no associated dysuria or urgency or frequency. There is very mild tenesmus. She is also having some mild, crampy midabdominal pain. There is no nausea or vomiting and no fever or chills.  Past Medical History  Diagnosis Date  . Medical history non-contributory    Past Surgical History  Procedure Laterality Date  . Cyst excision  2013    back side  . Fasciotomy Right 2013    lower leg  . Tibia im nail insertion Right 05/19/2013    Procedure: INTRAMEDULLARY (IM) NAIL TIBIAL;  Surgeon: Cammy Copa, MD;  Location: [facility] OR;  Service: Orthopedics;  Laterality: Right;   History reviewed. No pertinent family history. History  Substance Use Topics  . Smoking status: Never Smoker   . Smokeless tobacco: Not on file  . Alcohol Use: No   OB History    No data available     Review of Systems  Gastrointestinal: Positive for abdominal pain.  Genitourinary: Positive for hematuria.  All other systems reviewed and are negative.     Allergies  Review of patient's allergies indicates no known allergies.  Home Medications   Prior to Admission medications   Medication Sig Start Date End Date Taking? Authorizing Provider  aspirin EC 325 MG tablet Take 1 tablet (325 mg total) by mouth daily. 05/19/13   Cammy Copa, MD   HYDROcodone-acetaminophen (NORCO) 5-325 MG per tablet Take 1-2 tablets by mouth every 6 (six) hours as needed for pain. 05/19/13   Cammy Copa, MD  ibuprofen (ADVIL,MOTRIN) 200 MG tablet Take 200 mg by mouth every 6 (six) hours as needed for pain.    Historical Provider, MD  Loratadine (CLARITIN) 10 MG CAPS Take by mouth.    Historical Provider, MD   BP 117/61 mmHg  Pulse 71  Temp(Src) 98.3 F (36.8 C) (Oral)  Resp 16  SpO2 100%  LMP 03/21/2015 Physical Exam  Nursing note and vitals reviewed.  19 year old female, resting comfortably and in no acute distress. Vital signs are normal. Oxygen saturation is 100%, which is normal. Head is normocephalic and atraumatic. PERRLA, EOMI. Oropharynx is clear. Neck is nontender and supple without adenopathy or JVD. Back is nontender and there is no CVA tenderness. Lungs are clear without rales, wheezes, or rhonchi. Chest is nontender. Heart has regular rate and rhythm without murmur. Abdomen is soft, flat, nontender without masses or hepatosplenomegaly and peristalsis is normoactive. Extremities have no cyanosis or edema, full range of motion is present. Skin is warm and dry without rash. Neurologic: Mental status is normal, cranial nerves are intact, there are no motor or sensory deficits.  ED Course  Procedures (including critical care time) Labs Review Results for orders placed or performed during the hospital encounter of 04/04/15  Urinalysis, Routine w reflex microscopic-may I&O cath if menses (not at [facility])  Result Value Ref Range   Color,  Urine RED (A) YELLOW   APPearance TURBID (A) CLEAR   Specific Gravity, Urine 1.028 1.005 - 1.030   pH 5.0 5.0 - 8.0   Glucose, UA NEGATIVE NEGATIVE mg/dL   Hgb urine dipstick LARGE (A) NEGATIVE   Bilirubin Urine LARGE (A) NEGATIVE   Ketones, ur 15 (A) NEGATIVE mg/dL   Protein, ur 161 (A) NEGATIVE mg/dL   Urobilinogen, UA 1.0 0.0 - 1.0 mg/dL   Nitrite POSITIVE (A) NEGATIVE   Leukocytes, UA  MODERATE (A) NEGATIVE  Urine microscopic-add on  Result Value Ref Range   Squamous Epithelial / LPF RARE RARE   WBC, UA 3-6 <3 WBC/hpf   RBC / HPF TOO NUMEROUS TO COUNT <3 RBC/hpf   Bacteria, UA RARE RARE  POC urine preg, ED (not at [facility])  Result Value Ref Range   Preg Test, Ur NEGATIVE NEGATIVE   MDM   Final diagnoses:  Hematuria    Gross hematuria. Urinalysis is significant only for too numerous to count RBCs. There are 3-6 WBCs and few bacteria but this does not seem likely to be hemorrhagic cystitis given the lack of other symptoms. However, she will be given a short course of cephalexin and is referred to urology for further outpatient workup.    Dione Booze, MD 04/04/15 [phone]

## 2015-04-04 NOTE — Discharge Instructions (Signed)
Return if he develops a fever, increased pain, or vomiting.  Hematuria Hematuria is blood in your urine. It can be caused by a bladder infection, kidney infection, prostate infection, kidney stone, or cancer of your urinary tract. Infections can usually be treated with medicine, and a kidney stone usually will pass through your urine. If neither of these is the cause of your hematuria, further workup to find out the reason may be needed. It is very important that you tell your health care provider about any blood you see in your urine, even if the blood stops without treatment or happens without causing pain. Blood in your urine that happens and then stops and then happens again can be a symptom of a very serious condition. Also, pain is not a symptom in the initial stages of many urinary cancers. HOME CARE INSTRUCTIONS   Drink lots of fluid, 3-4 quarts a day. If you have been diagnosed with an infection, cranberry juice is especially recommended, in addition to large amounts of water.  Avoid caffeine, tea, and carbonated beverages because they tend to irritate the bladder.  Avoid alcohol because it may irritate the prostate.  Take all medicines as directed by your health care provider.  If you were prescribed an antibiotic medicine, finish it all even if you start to feel better.  If you have been diagnosed with a kidney stone, follow your health care provider's instructions regarding straining your urine to catch the stone.  Empty your bladder often. Avoid holding urine for long periods of time.  After a bowel movement, women should cleanse front to back. Use each tissue only once.  Empty your bladder before and after sexual intercourse if you are a female. SEEK MEDICAL CARE IF:  You develop back pain.  You have a fever.  You have a feeling of sickness in your stomach (nausea) or vomiting.  Your symptoms are not better in 3 days. Return sooner if you are getting worse. SEEK  IMMEDIATE MEDICAL CARE IF:   You develop severe vomiting and are unable to keep the medicine down.  You develop severe back or abdominal pain despite taking your medicines.  You begin passing a large amount of blood or clots in your urine.  You feel extremely weak or faint, or you pass out. MAKE SURE YOU:   Understand these instructions.  Will watch your condition.  Will get help right away if you are not doing well or get worse. Document Released: 08/26/2005 Document Revised: 01/10/2014 Document Reviewed: 04/26/2013 [facility] Patient Information 2015 [place], [place]. This information is not intended to replace advice given to you by your health care provider. Make sure you discuss any questions you have with your health care provider.  Cephalexin tablets or capsules What is this medicine? CEPHALEXIN (sef a LEX in) is a cephalosporin antibiotic. It is used to treat certain kinds of bacterial infections It will not work for colds, flu, or other viral infections. This medicine may be used for other purposes; ask your health care provider or pharmacist if you have questions. COMMON BRAND NAME(S): Biocef, Keflex, Keftab What should I tell my health care provider before I take this medicine? They need to know if you have any of these conditions: -kidney disease -stomach or intestine problems, especially colitis -an unusual or allergic reaction to cephalexin, other cephalosporins, penicillins, other antibiotics, medicines, foods, dyes or preservatives -pregnant or trying to get pregnant -breast-feeding How should I use this medicine? Take this medicine by mouth with a full  glass of water. Follow the directions on the prescription label. This medicine can be taken with or without food. Take your medicine at regular intervals. Do not take your medicine more often than directed. Take all of your medicine as directed even if you think you are better. Do not skip doses or stop your medicine  early. Talk to your pediatrician regarding the use of this medicine in children. While this drug may be prescribed for selected conditions, precautions do apply. Overdosage: If you think you have taken too much of this medicine contact a poison control center or emergency room at once. NOTE: This medicine is only for you. Do not share this medicine with others. What if I miss a dose? If you miss a dose, take it as soon as you can. If it is almost time for your next dose, take only that dose. Do not take double or extra doses. There should be at least 4 to 6 hours between doses. What may interact with this medicine? -probenecid -some other antibiotics This list may not describe all possible interactions. Give your health care provider a list of all the medicines, herbs, non-prescription drugs, or dietary supplements you use. Also tell them if you smoke, drink alcohol, or use illegal drugs. Some items may interact with your medicine. What should I watch for while using this medicine? Tell your doctor or health care professional if your symptoms do not begin to improve in a few days. Do not treat diarrhea with over the counter products. Contact your doctor if you have diarrhea that lasts more than 2 days or if it is severe and watery. If you have diabetes, you may get a false-positive result for sugar in your urine. Check with your doctor or health care professional. What side effects may I notice from receiving this medicine? Side effects that you should report to your doctor or health care professional as soon as possible: -allergic reactions like skin rash, itching or hives, swelling of the face, lips, or tongue -breathing problems -pain or trouble passing urine -redness, blistering, peeling or loosening of the skin, including inside the mouth -severe or watery diarrhea -unusually weak or tired -yellowing of the eyes, skin Side effects that usually do not require medical attention (report to  your doctor or health care professional if they continue or are bothersome): -gas or heartburn -genital or anal irritation -headache -joint or muscle pain -nausea, vomiting This list may not describe all possible side effects. Call your doctor for medical advice about side effects. You may report side effects to FDA at 1-800-FDA-1088. Where should I keep my medicine? Keep out of the reach of children. Store at room temperature between 59 and 86 degrees F (15 and 30 degrees C). Throw away any unused medicine after the expiration date. NOTE: This sheet is a summary. It may not cover all possible information. If you have questions about this medicine, talk to your doctor, pharmacist, or health care provider.  2015, [employer]. (2007-11-30 17:09:13)

## 2015-04-04 NOTE — ED Notes (Signed)
Pt in c/o lower abd pain and episode of hematuria today, no distress noted
